# Patient Record
Sex: Female | Born: 1950 | Hispanic: No | Marital: Married | Smoking: Never smoker
Health system: Southern US, Community
[De-identification: ages and names within clinical notes are randomized; demographics above are authoritative.]

---

## 2007-12-31 ENCOUNTER — Ambulatory Visit: Payer: Self-pay | Admitting: Family Medicine

## 2008-02-02 ENCOUNTER — Ambulatory Visit: Payer: Self-pay | Admitting: Family Medicine

## 2008-02-23 ENCOUNTER — Ambulatory Visit: Payer: Self-pay | Admitting: Family Medicine

## 2010-06-01 ENCOUNTER — Ambulatory Visit: Payer: Self-pay | Admitting: Family Medicine

## 2010-06-19 ENCOUNTER — Ambulatory Visit: Payer: Self-pay | Admitting: Family Medicine

## 2011-01-04 ENCOUNTER — Ambulatory Visit
Admission: RE | Admit: 2011-01-04 | Discharge: 2011-01-04 | Payer: Self-pay | Source: Home / Self Care | Attending: Family Medicine | Admitting: Family Medicine

## 2011-02-11 ENCOUNTER — Ambulatory Visit: Payer: Self-pay | Admitting: Family Medicine

## 2012-05-21 ENCOUNTER — Encounter: Payer: Self-pay | Admitting: Family Medicine

## 2012-05-21 ENCOUNTER — Ambulatory Visit (INDEPENDENT_AMBULATORY_CARE_PROVIDER_SITE_OTHER): Payer: Managed Care, Other (non HMO) | Admitting: Family Medicine

## 2012-05-21 VITALS — BP 116/80 | HR 80 | Wt 201.0 lb

## 2012-05-21 DIAGNOSIS — R4702 Dysphasia: Secondary | ICD-10-CM

## 2012-05-21 DIAGNOSIS — R4789 Other speech disturbances: Secondary | ICD-10-CM

## 2012-05-21 NOTE — Progress Notes (Signed)
  Subjective:    Patient ID: Kathy Deleon, female    DOB: 03-20-1951, 61 y.o.   MRN: [id number]  HPI For the last 6 months she has noted a feeling of solids as opposed to liquids getting stuck in her mid chest area. On occasion she would vomit. No weight loss, nausea, vomiting, change in bowel habits.   Review of Systems     Objective:   Physical Exam Alert and in no distress. Cardiac exam shows regular rhythm without murmurs gallops. Lungs clear to auscultation. Abdominal exam shows no palpable tenderness, hepatosplenomegaly.       Assessment & Plan:  Symptoms consistent with esophageal stricture. Refer to GI for further evaluation the

## 2012-06-02 ENCOUNTER — Encounter: Payer: Self-pay | Admitting: Internal Medicine

## 2012-06-02 HISTORY — PX: UPPER GASTROINTESTINAL ENDOSCOPY: SHX188

## 2012-06-05 ENCOUNTER — Encounter: Payer: Self-pay | Admitting: Family Medicine

## 2013-09-06 LAB — HM MAMMOGRAPHY

## 2013-09-07 ENCOUNTER — Encounter: Payer: Self-pay | Admitting: Internal Medicine

## 2013-09-28 ENCOUNTER — Telehealth: Payer: Self-pay | Admitting: Family Medicine

## 2013-09-28 NOTE — Telephone Encounter (Signed)
Help her out with this 

## 2013-09-29 NOTE — Telephone Encounter (Signed)
done

## 2013-09-29 NOTE — Telephone Encounter (Signed)
Done

## 2013-10-01 ENCOUNTER — Telehealth: Payer: Self-pay | Admitting: Family Medicine

## 2013-10-01 NOTE — Telephone Encounter (Signed)
Please call patient concerning her breat ultrasound order

## 2013-10-07 NOTE — Telephone Encounter (Signed)
I HAVE CALLED SEVERAL TIMES WITH NO ANSWER TO CALL CAN NOT LEAVE MESSAGE

## 2013-10-12 NOTE — Telephone Encounter (Signed)
Pt just wanted to make sure her ultasound order was in

## 2013-10-12 NOTE — Telephone Encounter (Signed)
Please call concerning ultrasound order

## 2013-10-19 LAB — HM MAMMOGRAPHY

## 2013-10-27 ENCOUNTER — Encounter: Payer: Self-pay | Admitting: Family Medicine

## 2013-10-28 ENCOUNTER — Encounter: Payer: Self-pay | Admitting: Internal Medicine

## 2014-10-20 ENCOUNTER — Encounter: Payer: Self-pay | Admitting: Internal Medicine

## 2015-06-13 ENCOUNTER — Encounter: Payer: Self-pay | Admitting: Family Medicine

## 2015-06-14 ENCOUNTER — Encounter: Payer: Self-pay | Admitting: Medical

## 2015-07-14 LAB — HM MAMMOGRAPHY: HM MAMMO: NORMAL

## 2015-11-26 ENCOUNTER — Emergency Department (HOSPITAL_COMMUNITY)
Admission: EM | Admit: 2015-11-26 | Discharge: 2015-11-26 | Disposition: A | Discharge status: Home / Self Care | Payer: Managed Care, Other (non HMO) | Source: Home / Self Care

## 2015-11-26 ENCOUNTER — Encounter (HOSPITAL_COMMUNITY): Payer: Self-pay | Admitting: Emergency Medicine

## 2015-11-26 DIAGNOSIS — K8050 Calculus of bile duct without cholangitis or cholecystitis without obstruction: Secondary | ICD-10-CM | POA: Diagnosis not present

## 2015-11-26 LAB — POCT URINALYSIS DIP (DEVICE)
GLUCOSE, UA: NEGATIVE mg/dL
KETONES UR: 15 mg/dL — AB
Nitrite: NEGATIVE
PH: 5.5 (ref 5.0–8.0)
PROTEIN: 30 mg/dL — AB
Urobilinogen, UA: 1 mg/dL (ref 0.0–1.0)

## 2015-11-26 NOTE — ED Notes (Addendum)
Pt here with c/o constant epigastric pain with worsening shoulder pain Sx's started last Friday followed with x2 emesis Denies chest pain or SOB No medical hx reported  EKG obtained and urine sample

## 2015-11-26 NOTE — ED Provider Notes (Addendum)
CSN: [id number]     Arrival date & time 11/26/15  1358 History   None    Chief Complaint  Patient presents with  . Abdominal Pain   (Consider location/radiation/quality/duration/timing/severity/associated sxs/prior Treatment) HPI  History obtained from patient:   LOCATION: Upper epigastric pain SEVERITY: 4 DURATION: Several minutes to 30 minutes CONTEXT: Usually after eating QUALITY: Sharp tightness pain MODIFYING FACTORS: Omeprazole helps a great deal. ASSOCIATED SYMPTOMS: Pain radiates to shoulder TIMING: Episodic OCCUPATION: Retired Engineer, civil (consulting)nurse  History reviewed. No pertinent past medical history. Past Surgical History  Procedure Laterality Date  . Upper gastrointestinal endoscopy  06/02/12   No family history on file. Social History  Substance Use Topics  . Smoking status: Never Smoker   . Smokeless tobacco: Never Used  . Alcohol Use: Yes     Comment: ocassionally   OB History    No data available     Review of Systems ROS +'ve EPIGASTRIC pain  Denies: HEADACHE, NAUSEA,   CHEST PAIN, CONGESTION, DYSURIA, SHORTNESS OF BREATH  Allergies  Review of patient's allergies indicates no known allergies.  Home Medications   Prior to Admission medications   Medication Sig Start Date End Date Taking? Authorizing Provider  Multiple Vitamins-Minerals (MULTIVITAMIN WITH MINERALS) tablet Take 1 tablet by mouth daily.    Historical Provider, MD   Meds Ordered and Administered this Visit  Medications - No data to display  BP 138/84 mmHg  Pulse 64  Temp(Src) 98.2 F (36.8 C) (Oral)  SpO2 97% No data found.   Physical Exam  Constitutional: She appears well-developed and well-nourished.  HENT:  Head: Normocephalic.  Right Ear: External ear normal.  Mouth/Throat: Oropharynx is clear and moist.  Eyes: Conjunctivae are normal.  Cardiovascular: Normal rate.   Pulmonary/Chest: Effort normal and breath sounds normal.  Abdominal: Soft. There is tenderness in the right upper  quadrant. There is guarding and positive Murphy's sign.    ED Course  Procedures (including critical care time)  Labs Review Labs Reviewed  POCT URINALYSIS DIP (DEVICE) - Abnormal; Notable for the following:    Bilirubin Urine MODERATE (*)    Ketones, ur 15 (*)    Hgb urine dipstick TRACE (*)    Protein, ur 30 (*)    Leukocytes, UA TRACE (*)    All other components within normal limits    Imaging Review No results found.   Visual Acuity Review  Right Eye Distance:   Left Eye Distance:   Bilateral Distance:    Right Eye Near:   Left Eye Near:    Bilateral Near:        EKG: normal EKG, normal sinus rhythm, there are no previous tracings available for comparison.   MDM   1. Biliary colic     Patient states that she does not believe her heart is the issue. She believes that she has gallbladder issues and I have to agree with her she does have tenderness in the upper quadrant with a positive Murphy sign. Patient is not in extremist at this time and I don't feel she needs emergent ultrasound scan today I have advised her that she needs to see her doctor tomorrow and arrange for a gallbladder ultrasound to be done. A shoe has asked that she is spilling bilirubin in her urine and that is affirmative. Patient states that she will limit her to bland food at this time and continue her omeprazole. She will follow-up with her primary care provider tomorrow. She is also advised that the pain  is worsened she should go to the emergency department for further evaluation.    Tharon Aquas, PA 11/26/15 1916  Tharon Aquas, [place] 12/29/15 [phone]

## 2015-11-26 NOTE — Discharge Instructions (Signed)
Cholelithiasis Cholelithiasis (also called gallstones) is a form of gallbladder disease. The gallbladder is a small organ that helps you digest fats. Symptoms of gallstones are:  Feeling sick to your stomach (nausea).  Throwing up (vomiting).  Belly pain.  Yellowing of the skin (jaundice).  Sudden pain. You may feel the pain for minutes to hours.  Fever.  Pain to the touch. HOME CARE  Only take medicines as told by your doctor.  Eat a low-fat diet until you see your doctor again. Eating fat can result in pain.  Follow up with your doctor as told. Attacks usually happen time after time. Surgery is usually needed for permanent treatment. GET HELP RIGHT AWAY IF:   Your pain gets worse.  Your pain is not helped by medicines.  You have a fever and lasting symptoms for more than 2-3 days.  You have a fever and your symptoms suddenly get worse.  You keep feeling sick to your stomach and throwing up. MAKE SURE YOU:   Understand these instructions.  Will watch your condition.  Will get help right away if you are not doing well or get worse.   This information is not intended to replace advice given to you by your health care provider. Make sure you discuss any questions you have with your health care provider.   Document Released: 05/13/2008 Document Revised: 07/28/2013 Document Reviewed: 05/19/2013 Elsevier Interactive Patient Education [address] [employer]     Results for Kathy Deleon, Kathy Deleon (MRN [id number]) as of 11/26/2015 15:17  Ref. Range 07/14/2015 00:00 11/26/2015 14:53  Bilirubin Urine Latest Ref Range: NEGATIVE   MODERATE (A)  Glucose Latest Ref Range: NEGATIVE mg/dL  NEGATIVE  Hgb urine dipstick Latest Ref Range: NEGATIVE   TRACE (A)  Ketones, ur Latest Ref Range: NEGATIVE mg/dL  15 (A)  Leukocytes, UA Latest Ref Range: NEGATIVE   TRACE (A)  Nitrite Latest Ref Range: NEGATIVE   NEGATIVE  pH Latest Ref Range: 5.0-8.0   5.5  Protein Latest Ref Range: NEGATIVE mg/dL   30 (A)  Specific Gravity, Urine Latest Ref Range: 1.005-1.030   >=1.030  Urobilinogen, UA Latest Ref Range: 0.0-1.0 mg/dL  1.0

## 2015-11-27 ENCOUNTER — Ambulatory Visit (INDEPENDENT_AMBULATORY_CARE_PROVIDER_SITE_OTHER): Payer: Managed Care, Other (non HMO) | Admitting: Family Medicine

## 2015-11-27 ENCOUNTER — Encounter: Payer: Self-pay | Admitting: Family Medicine

## 2015-11-27 VITALS — BP 106/76 | HR 66 | Resp 14 | Wt 186.4 lb

## 2015-11-27 DIAGNOSIS — R1011 Right upper quadrant pain: Secondary | ICD-10-CM

## 2015-11-27 NOTE — Progress Notes (Signed)
   Subjective:    Patient ID: Kathy ChambersConstance Deleon, female    DOB: 05/11/1951, 64 y.o.   MRN: [id number]  HPI She is here for a for evaluation after recently being seen in an urgent care center. 10 days ago she developed mid epigastric pain with radiation into her back. It lasted several hours. This again occurred last weekend. It occurred several hours after she ate.He did have 2 episodes of vomiting.No bloating, diarrhea or chest pain. She cannot relate this to eating greasy foods. She has been taking Prilosec with no real benefit. The urgent care Record was reviewed.   Review of Systems     Objective:   Physical Exam Alert and in no distress. Cardiac exam shows regular rhythm without murmurs or gallops. Lungs are clear to auscultation. Abdominal exam shows active bowel sounds with positive Murphy's sign and Murphy's punch. No rebound or referred pain.       Assessment & Plan:  Abdominal pain, right upper quadrant - Plan: US Abdomen Limited RUQ discussed acute treatment and at this point she is not interested in any pain medication.

## 2015-11-28 ENCOUNTER — Other Ambulatory Visit: Payer: Self-pay

## 2015-11-28 ENCOUNTER — Ambulatory Visit
Admission: RE | Admit: 2015-11-28 | Discharge: 2015-11-28 | Disposition: A | Discharge status: Home / Self Care | Payer: Managed Care, Other (non HMO) | Source: Ambulatory Visit | Attending: Family Medicine | Admitting: Family Medicine

## 2015-11-28 ENCOUNTER — Telehealth: Payer: Self-pay

## 2015-11-28 DIAGNOSIS — R1011 Right upper quadrant pain: Secondary | ICD-10-CM

## 2015-11-28 DIAGNOSIS — K769 Liver disease, unspecified: Secondary | ICD-10-CM

## 2015-11-28 NOTE — Telephone Encounter (Signed)
-----   Message from Ronnald NianJohn C Lalonde, MD sent at 11/28/2015  4:12 PM EST ----- I discussed the findings with her she will be set up to see a general surgeon and also to get an MRI on her liver. Find out from radiology whether this is with contrast are without

## 2015-11-28 NOTE — Telephone Encounter (Signed)
The ultrasound results for Kathy Deleon are in. Please advise these results as the pt called requesting them.

## 2015-11-29 ENCOUNTER — Other Ambulatory Visit: Payer: Managed Care, Other (non HMO)

## 2015-11-29 ENCOUNTER — Telehealth: Payer: Self-pay

## 2015-11-29 NOTE — Telephone Encounter (Signed)
[facility] with Dr. Corliss Skainssuei @10 :20am. Pt is aware

## 2015-11-30 NOTE — Telephone Encounter (Signed)
Pt advised that report ready for pick up at front desk

## 2015-11-30 NOTE — Telephone Encounter (Signed)
Give her a copy of the ultrasound report. He might want to call her and let her know that it is ready at the front desk

## 2015-11-30 NOTE — Telephone Encounter (Signed)
Have her let me know if her symptoms get worse and I will push for being seen sooner

## 2015-11-30 NOTE — Telephone Encounter (Signed)
Pt wants to pick up ultrasound results. She made an appt earlier for MRI for Tuesday 12/05/15. And she [facility] and [facility] one [facility] for Thursday 12/07/15.

## 2015-12-05 ENCOUNTER — Ambulatory Visit
Admission: RE | Admit: 2015-12-05 | Discharge: 2015-12-05 | Disposition: A | Discharge status: Home / Self Care | Payer: Managed Care, Other (non HMO) | Source: Ambulatory Visit | Attending: Family Medicine | Admitting: Family Medicine

## 2015-12-05 DIAGNOSIS — K769 Liver disease, unspecified: Secondary | ICD-10-CM

## 2015-12-05 MED ORDER — GADOBENATE DIMEGLUMINE 529 MG/ML IV SOLN
17.0000 mL | Freq: Once | INTRAVENOUS | Status: AC | PRN
  Administered 2015-12-05: 17 mL via INTRAVENOUS

## 2015-12-08 ENCOUNTER — Other Ambulatory Visit: Payer: Managed Care, Other (non HMO)

## 2016-01-04 ENCOUNTER — Telehealth: Payer: Self-pay | Admitting: Family Medicine

## 2016-01-04 NOTE — Telephone Encounter (Signed)
Kathy Deleon came in today to discuss several things with our office.  She does not like to be called honey or sweetie.  She wants to be called Kathy Deleon.  She states unless you really know someone they should never be called that.  And she states it is a [place] culture thing.  She is not happy with the fact that her husband rcd a survey from his visit, which spurred the conversation as to why he came to the doctor.  I explained the surveys go out randomly and I was working on getting signage so that the patients will be aware they may receive surveys.  She said anything of that personal nature, no survey should go out about.  She agreed the survey's were very generic.  I also advised I have learned of the DO NOT SURVEY LIST.  She is not happy with the fact we were ok with her waiting 2 weeks out for her gall stone issue with [facility].  Which she cancelled and called [facility] and had the surgery before that 2 week period was up.  She said she was told to call us over the weekend if things got worse.  We never tried to get her in earlier anywhere.  She is not happy that she had a form for [employer] that we would not complete due to the fact she had not a physical with [place] and we made her make an appt.  So she left from here an went to an urgent care and paid them $40.00 for a sports cpe.  She said it really wasn't a physical.  I explained that is our normal protocol, that I would have happily looked at the form to see what we could have done for her.  She stated that she loved Dr. Susann Givens and had been seeing him for years, but she was going to find another dr.  I went over each of her complaints and continued to thank her for bringing these to my attention.  Advising we will work on all of these things.  I assured her if I had know of any of these issues, I would have helped her.  She advised she didn't know I existed.  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    I  expressed my concerns and also advised her that some of her issues had already been remedied.  So she acted as is she may re-consider.

## 2017-04-25 IMAGING — MR MR ABDOMEN WO/W CM
17 series · 48 of 48 positions shown · IV contrast (multihance)
Comparison: Ultrasound exam from 11/28/2015.

ADDENDUM:
A tiny stone or polyp is identified in the lumen of the gallbladder
(see image 15 of series 6). Given the appearance on recent
ultrasound exam and the dependent nature of this finding, it likely
represents a tiny stone. There is no associated gallbladder wall
thickening or pericholecystic fluid. No biliary dilatation.
CLINICAL DATA: Complex cyst identified in the liver on recent
ultrasound exam.

EXAM:
MRI ABDOMEN WITHOUT AND WITH CONTRAST
TECHNIQUE: Multiplanar multisequence MR imaging of the abdomen was performed
both before and after the administration of intravenous contrast.
CONTRAST:  17mL MULTIHANCE GADOBENATE DIMEGLUMINE 529 MG/ML IV SOLN

[Series 4: T2 · coronal · 5.0mm · 1.56mm/px · 1 of 22 slices shown (1 of 3)]
[im 1/22]
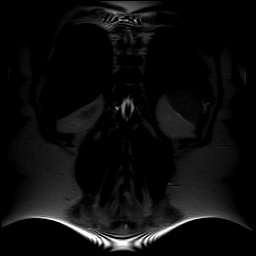

[Series 5: axial tru fisp · axial · 4.0mm · 1.48mm/px · 1 of 34 slices shown]
[im 1/34]
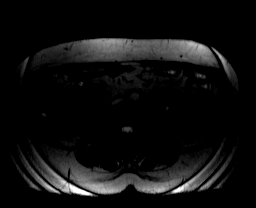

[Series 6: T2 · axial · 6.5mm · 0.74mm/px · 1 of 26 slices shown (2 of 3)]
[im 1/26]
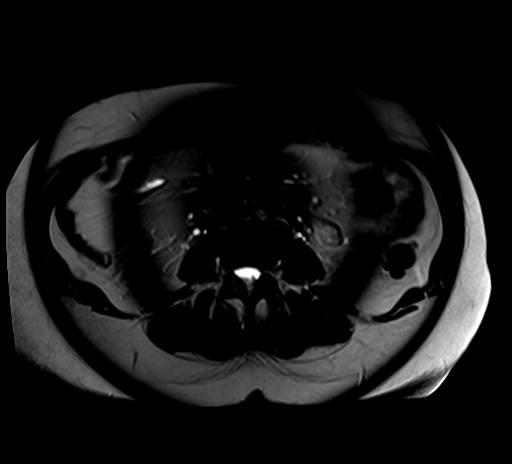

[Series 7: [id number] · axial · 8.0mm · 1.98mm/px · z∈[-55,+132]mm · 2 of 57 slices shown]
[im 1/57]
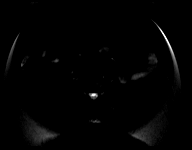
[im 57/57]
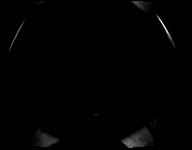

[Series 8: [id number]_adc · axial · 8.0mm · 1.98mm/px · 1 of 19 slices shown]
[im 1/19]
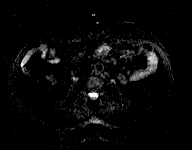

[Series 9: T2 · axial · 4.9mm · 1.48mm/px · 1 of 34 slices shown (3 of 3)]
[im 1/34]
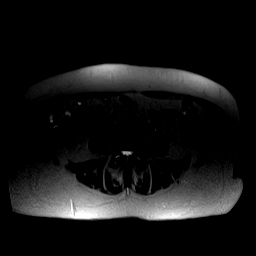

[Series 10: axial in out · axial · 6.0mm · 0.70mm/px · z∈[-61,+126]mm · 2 of 54 slices shown]
[im 1/54]
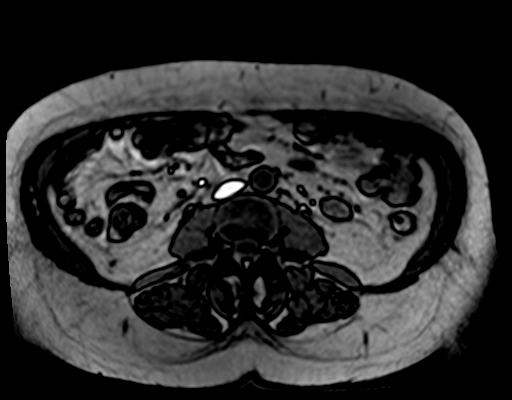
[im 54/54]
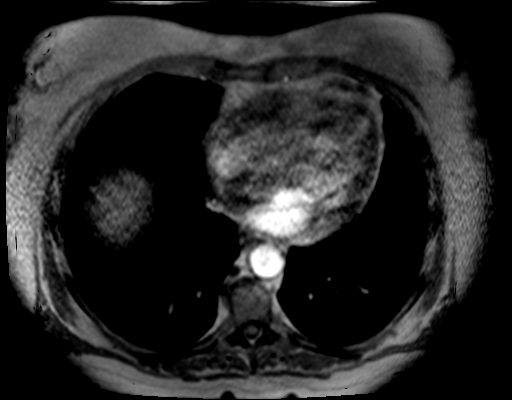

[Series 11: T1 dynamic · axial · non-contrast · 2.5mm · 1.56mm/px · z∈[-53,+124]mm · 3 of 72 slices shown (1 of 2)]
[im 1/72]
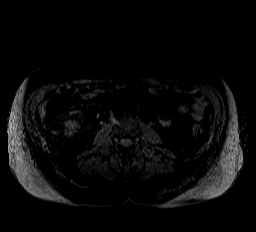
[im 36/72]
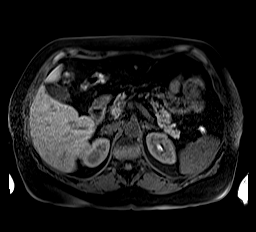
[im 72/72]
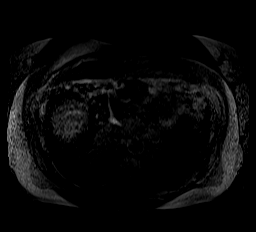

[Series 12: post 25 sec · axial · 2.5mm · 1.56mm/px · z∈[-53,+124]mm · 4 of 72 slices shown]
[im 1/72]
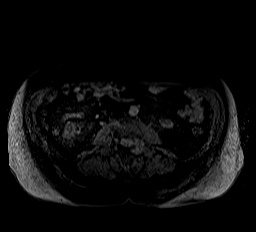
[im 24/72]
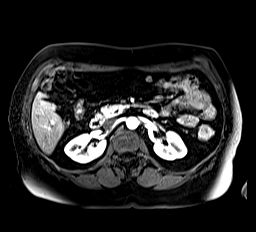
[im 48/72]
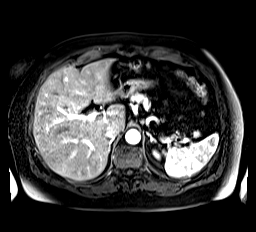
[im 72/72]
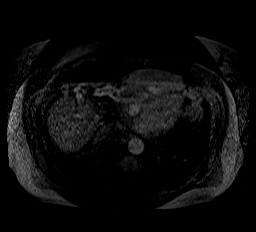

[Series 13: post 25 sec_sub · axial · 2.5mm · 1.56mm/px · z∈[-53,+124]mm · 4 of 72 slices shown]
[im 1/72]
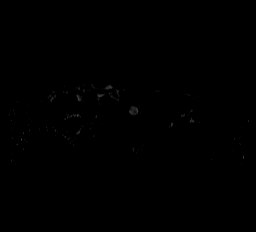
[im 24/72]
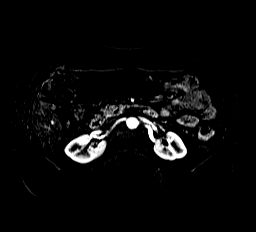
[im 48/72]
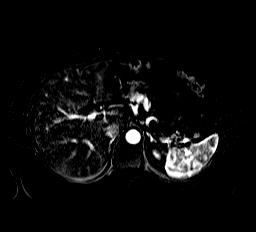
[im 72/72]
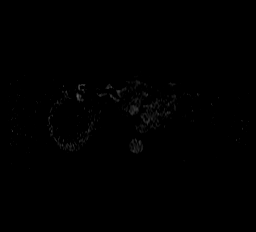

[Series 14: post 45 sec · axial · 2.5mm · 1.56mm/px · z∈[-53,+124]mm · 4 of 72 slices shown]
[im 1/72]
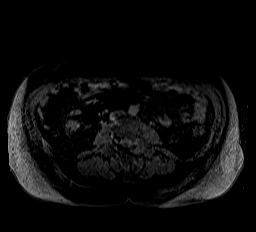
[im 24/72]
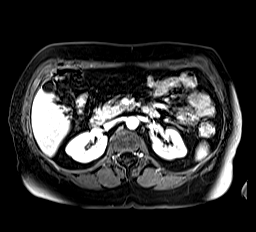
[im 48/72]
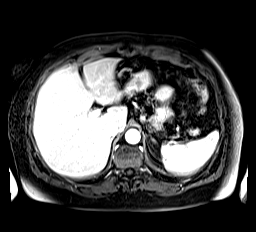
[im 72/72]
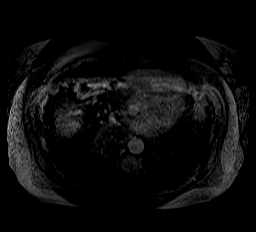

[Series 15: post 45 sec_sub · axial · 2.5mm · 1.56mm/px · z∈[-53,+124]mm · 4 of 72 slices shown]
[im 1/72]
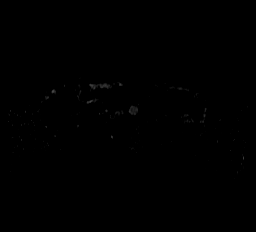
[im 24/72]
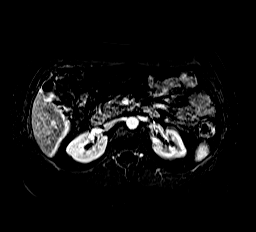
[im 48/72]
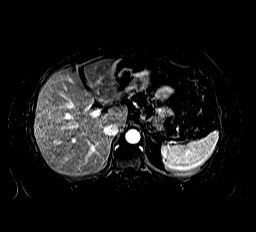
[im 72/72]
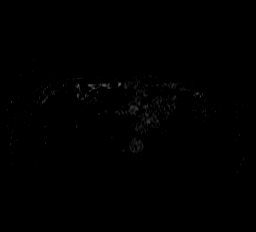

[Series 16: post 90 sec · axial · 2.5mm · 1.56mm/px · z∈[-53,+124]mm · 4 of 72 slices shown]
[im 1/72]
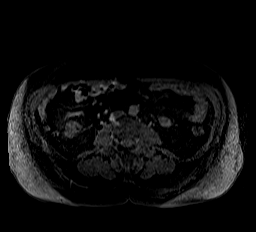
[im 24/72]
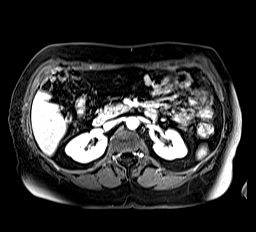
[im 48/72]
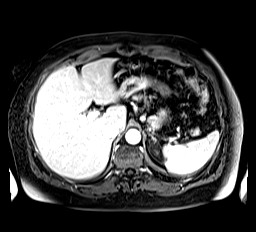
[im 72/72]
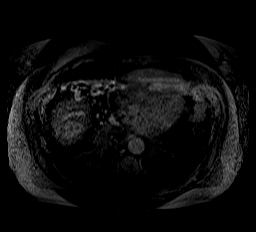

[Series 17: post 90 sec_sub · axial · 2.5mm · 1.56mm/px · z∈[-53,+124]mm · 4 of 72 slices shown]
[im 1/72]
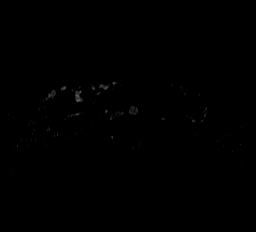
[im 24/72]
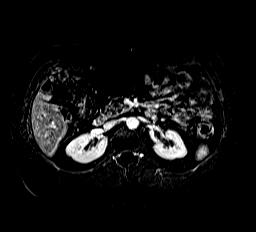
[im 48/72]
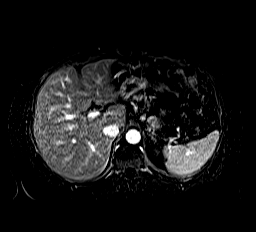
[im 72/72]
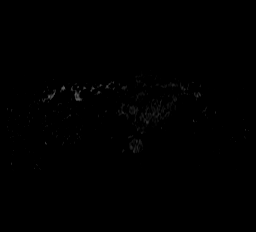

[Series 18: T1 dynamic · coronal · 3.0mm · 1.56mm/px · 4 of 72 slices shown (2 of 2)]
[im 1/72]
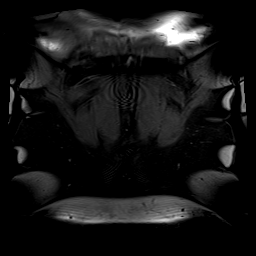
[im 24/72]
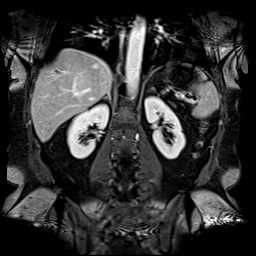
[im 48/72]
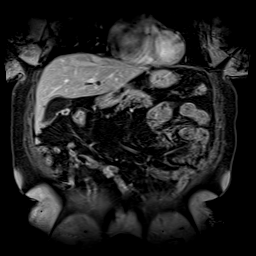
[im 72/72]
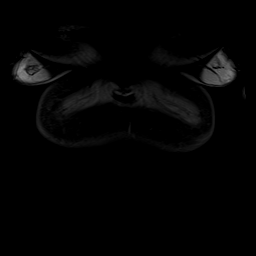

[Series 19: post 3 min · axial · 2.5mm · 1.56mm/px · z∈[-53,+124]mm · 4 of 72 slices shown]
[im 1/72]
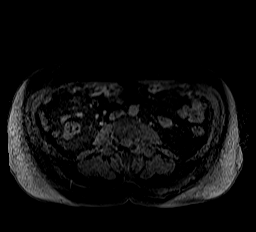
[im 24/72]
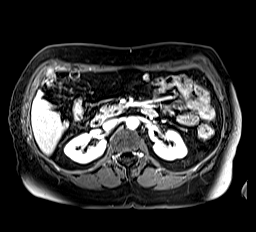
[im 48/72]
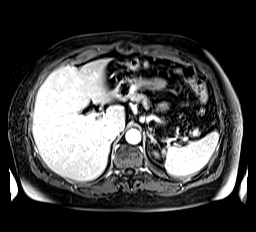
[im 72/72]
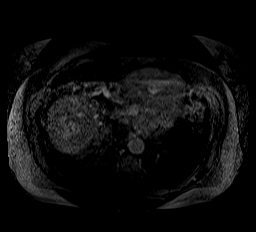

[Series 20: post 3 min_sub · axial · 2.5mm · 1.56mm/px · z∈[-53,+124]mm · 4 of 72 slices shown]
[im 1/72]
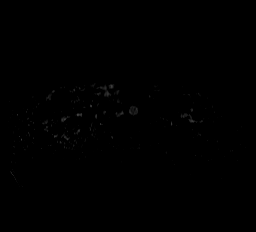
[im 24/72]
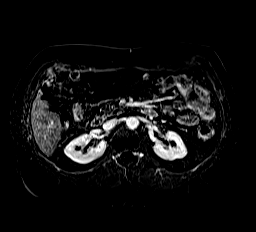
[im 48/72]
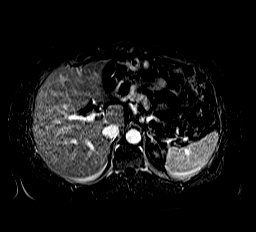
[im 72/72]
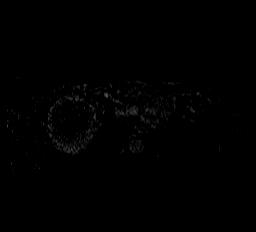

[48 of 48 positions shown; findings below may reference images not displayed]

FINDINGS: Lower chest:  Unremarkable.

Hepatobiliary: The lesion in question on recent ultrasound exam
represents a bilobed cyst or 2 adjacent lesions measuring 1.6 x
cm in total. No mural thickening or wall enhancement. A second 8 x
10 mm subcapsular cyst is identified in the posterior hepatic dome
without suspicious or worrisome features. Liver otherwise
unremarkable without enhancing mass lesion.

Pancreas: No focal mass lesion. No dilatation of the main duct. No
intraparenchymal cyst. No peripancreatic edema.

Spleen: No splenomegaly. No focal mass lesion.

Adrenals/Urinary Tract: No adrenal nodule or mass. Small central
sinus cysts noted towards the lower pole left kidney. No enhancing
lesion identified in either kidney. No hydronephrosis.

Stomach/Bowel: Stomach is nondistended. No gastric wall thickening.
No evidence of outlet obstruction. Duodenum is normally positioned
as is the ligament of Treitz. Visualized portions of the small bowel
and colon are unremarkable except for some diverticular disease
noted in the left colon.

Vascular/Lymphatic: No abdominal aortic aneurysm. No lymphadenopathy
is evident in the abdomen.

Other: No intraperitoneal free fluid.

Musculoskeletal: No abnormal marrow signal within the visualized
bony anatomy.
IMPRESSION: 1. Septated versus 2 adjacent cysts in the subcapsular aspect of the
inferior right liver accounts for the abnormality seen on the recent
ultrasound exam. No worrisome or suspicious features associated with
this lesion.
2. Benign cystic lesion also identified in the subcapsular aspect of
the posterior hepatic dome.
3. Left colonic diverticulosis.

## 2020-01-10 ENCOUNTER — Ambulatory Visit: Payer: Managed Care, Other (non HMO)

## 2020-01-16 ENCOUNTER — Ambulatory Visit: Payer: Medicare Other | Attending: Internal Medicine

## 2020-01-16 DIAGNOSIS — Z23 Encounter for immunization: Secondary | ICD-10-CM | POA: Insufficient documentation

## 2020-01-16 NOTE — Progress Notes (Signed)
   Covid-19 Vaccination Clinic  Name:  Kathy Deleon    MRN: [id number] DOB: 1951-04-19  01/16/2020  [name] was observed post Covid-19 immunization for 15 minutes without incidence. She was provided with Vaccine Information Sheet and instruction to access the V-Safe system.   [name] was instructed to call 911 with any severe reactions post vaccine: Marland Kitchen Difficulty breathing  . Swelling of your face and throat  . A fast heartbeat  . A bad rash all over your body  . Dizziness and weakness    Immunizations Administered    Name Date Dose VIS Date Route   Pfizer COVID-19 Vaccine 01/16/2020 11:09 AM 0.3 mL 11/19/2019 Intramuscular   Manufacturer: [employer], [employer]   Lot: [id number]   NDC: [address]-3

## 2020-02-09 ENCOUNTER — Ambulatory Visit: Payer: Medicare Other

## 2020-02-09 ENCOUNTER — Ambulatory Visit: Payer: Medicare Other | Attending: Internal Medicine

## 2020-02-09 DIAGNOSIS — Z23 Encounter for immunization: Secondary | ICD-10-CM

## 2020-02-09 NOTE — Progress Notes (Signed)
   Covid-19 Vaccination Clinic  Name:  Kathy Deleon    MRN: [id number] DOB: 02-06-1951  02/09/2020  Kathy Deleon was observed post Covid-19 immunization for 15 minutes without incident. She was provided with Vaccine Information Sheet and instruction to access the V-Safe system.   Kathy Deleon was instructed to call 911 with any severe reactions post vaccine: Marland Kitchen Difficulty breathing  . Swelling of face and throat  . A fast heartbeat  . A bad rash all over body  . Dizziness and weakness   Immunizations Administered    Name Date Dose VIS Date Route   Pfizer COVID-19 Vaccine 02/09/2020 10:28 AM 0.3 mL 11/19/2019 Intramuscular   Manufacturer: [employer], [employer]   Lot: [id number]   NDC: [address]-1
